# Patient Record
Sex: Male | Born: 1995 | Race: White | Hispanic: Yes | Marital: Single | Smoking: Never smoker
Health system: Southern US, Community
[De-identification: ages and names within clinical notes are randomized; demographics above are authoritative.]

---

## 2016-10-23 ENCOUNTER — Emergency Department (HOSPITAL_COMMUNITY)
Admission: EM | Admit: 2016-10-23 | Discharge: 2016-10-23 | Disposition: A | Discharge status: Home / Self Care | Payer: No Typology Code available for payment source | Attending: Emergency Medicine | Admitting: Emergency Medicine

## 2016-10-23 ENCOUNTER — Emergency Department (HOSPITAL_COMMUNITY): Payer: No Typology Code available for payment source

## 2016-10-23 ENCOUNTER — Encounter (HOSPITAL_COMMUNITY): Payer: Self-pay | Admitting: *Deleted

## 2016-10-23 DIAGNOSIS — S20211A Contusion of right front wall of thorax, initial encounter: Secondary | ICD-10-CM | POA: Diagnosis not present

## 2016-10-23 DIAGNOSIS — Y998 Other external cause status: Secondary | ICD-10-CM | POA: Diagnosis not present

## 2016-10-23 DIAGNOSIS — Y939 Activity, unspecified: Secondary | ICD-10-CM | POA: Insufficient documentation

## 2016-10-23 DIAGNOSIS — S299XXA Unspecified injury of thorax, initial encounter: Secondary | ICD-10-CM | POA: Diagnosis present

## 2016-10-23 DIAGNOSIS — Y92410 Unspecified street and highway as the place of occurrence of the external cause: Secondary | ICD-10-CM | POA: Insufficient documentation

## 2016-10-23 MED ORDER — CYCLOBENZAPRINE HCL 10 MG PO TABS: 10.0000 mg | ORAL_TABLET | Freq: Two times a day (BID) | ORAL | 0 refills | Status: DC | PRN

## 2016-10-23 MED ORDER — KETOROLAC TROMETHAMINE 30 MG/ML IJ SOLN
30.0000 mg | Freq: Once | INTRAMUSCULAR | Status: AC
  Administered 2016-10-23: 30 mg via INTRAMUSCULAR
  Filled 2016-10-23: qty 1

## 2016-10-23 MED ORDER — IBUPROFEN 600 MG PO TABS: 600.0000 mg | ORAL_TABLET | Freq: Four times a day (QID) | ORAL | 0 refills | Status: DC | PRN

## 2016-10-23 NOTE — ED Notes (Signed)
Patient transported to X-ray 

## 2016-10-23 NOTE — ED Notes (Signed)
Got patient undress on the monitor patient is resting with family at beside call bell at reach
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
## 2017-07-18 ENCOUNTER — Other Ambulatory Visit: Payer: Self-pay

## 2017-07-18 ENCOUNTER — Encounter: Payer: Self-pay | Admitting: Emergency Medicine

## 2017-07-18 ENCOUNTER — Ambulatory Visit: Payer: 59 | Admitting: Emergency Medicine

## 2017-07-18 VITALS — BP 127/66 | HR 69 | Temp 98.9°F | Resp 16 | Ht 68.0 in | Wt 212.6 lb

## 2017-07-18 DIAGNOSIS — H9203 Otalgia, bilateral: Secondary | ICD-10-CM

## 2017-07-18 DIAGNOSIS — H6693 Otitis media, unspecified, bilateral: Secondary | ICD-10-CM | POA: Diagnosis not present

## 2017-07-18 MED ORDER — HYDROCORTISONE-ACETIC ACID 1-2 % OT SOLN: 3.0000 [drp] | Freq: Three times a day (TID) | OTIC | 1 refills | Status: DC

## 2017-07-18 MED ORDER — CIPROFLOXACIN HCL 500 MG PO TABS: 500.0000 mg | ORAL_TABLET | Freq: Two times a day (BID) | ORAL | 0 refills | Status: DC

## 2017-07-18 NOTE — Patient Instructions (Addendum)
     IF you received an x-ray today, you will receive an invoice [facility]. Please contact [facility] at [phone] with questions or concerns regarding your invoice.   IF you received labwork today, you will receive an invoice from [facility]. Please contact [facility] at [phone] with questions or concerns regarding your invoice.   Our billing staff will not be able to assist you with questions regarding bills from these companies.  You will be contacted with the lab results as soon as they are available. The fastest way to get your results is to activate your My Chart account. Instructions are located on the last page of this paperwork. If you have not heard from us regarding the results in 2 weeks, please contact this office.     Otitis externa (Otitis Externa) La otitis externa es una infeccin del canal auditivo externo. El canal auditivo externo se ubica entre la parte externa del odo y la membrana del tmpano. A veces a la otitis externa se la conoce como "odo del nadador". CUIDADOS EN EL HOGAR  Si le indicaron que se coloque gotas para los odos con antibitico, pngaselas como se lo haya indicado el mdico. No deje de usarlas aunque la afeccin mejore.  Tome los medicamentos de venta libre y los recetados solamente como se lo haya indicado el mdico.  OceanographerConcurra a todas las visitas de control como se lo haya indicado el mdico. Esto es importante. PREVENCIN  Mantenga el odo seco. Use la punta de una toalla para secarse el odo despus de nadar o darse un bao.  Intente no rascarse ni ponerse cosas en el odo. Esto facilita el ingreso de los grmenes al odo.  No nade en lagos, agua sucia o piscinas que pueden no contener la cantidad correcta de una sustancia qumica llamada cloro.  Considere la posibilidad de preparar gotas ticas y ponerse 3 o 4gotas en cada odo despus de nadar. Pregntele al mdico cmo puede preparar las gotas para los  odos. SOLICITE AYUDA SI:  Tiene fiebre.  El odo sigue estando rojo, hinchado o le duele despus de 3das.  Sigue teniendo secrecin de pus despus de 3das.  El dolor, la hinchazn o el enrojecimiento empeoran.  Siente un dolor de cabeza muy intenso.  Tiene enrojecimiento, hinchazn, dolor o sensibilidad detrs de Museum/gallery conservatorla oreja. Esta informacin no tiene Theme park managercomo fin reemplazar el consejo del mdico. Asegrese de hacerle al mdico cualquier pregunta que tenga. Document Released: 07/08/2011 Document Revised: 05/06/2014 Document Reviewed: 01/23/2015 Elsevier Interactive Patient Education  [employer]2018 Elsevier Inc.

## 2017-07-18 NOTE — Progress Notes (Signed)
Robert Olson 22 y.o.   Chief Complaint  Patient presents with  . Ear Pain    x 2 months RIGHT has pain and LEFT has pain with ringing    HISTORY OF PRESENT ILLNESS: This is a 22 y.o. male complaining of bilateral ear pain for months maybe even a year.  Hearing on the left ear has been affected.  Denies trauma or any other significant symptoms.  Corporate investment banker.  Non-smoker.  Presently on no medications.  HPI   Prior to Admission medications   Medication Sig Start Date End Date Taking? Authorizing Provider  cyclobenzaprine (FLEXERIL) 10 MG tablet Take 1 tablet (10 mg total) by mouth 2 (two) times daily as needed for muscle spasms. Patient not taking: Reported on 07/18/2017 10/23/16   Jacalyn Lefevre, MD  ibuprofen (ADVIL,MOTRIN) 600 MG tablet Take 1 tablet (600 mg total) by mouth every 6 (six) hours as needed. Patient not taking: Reported on 07/18/2017 10/23/16   Jacalyn Lefevre, MD    Allergies  Allergen Reactions  . Penicillins     There are no active problems to display for this patient.   No past medical history on file.  No past surgical history on file.  Social History   Socioeconomic History  . Marital status: Single    Spouse name: Not on file  . Number of children: Not on file  . Years of education: Not on file  . Highest education level: Not on file  Occupational History  . Not on file  Social Needs  . Financial resource strain: Not on file  . Food insecurity:    Worry: Not on file    Inability: Not on file  . Transportation needs:    Medical: Not on file    Non-medical: Not on file  Tobacco Use  . Smoking status: Never Smoker  . Smokeless tobacco: Never Used  Substance and Sexual Activity  . Alcohol use: Yes    Comment: occ  . Drug use: No  . Sexual activity: Not on file  Lifestyle  . Physical activity:    Days per week: Not on file    Minutes per session: Not on file  . Stress: Not on file  Relationships  . Social connections:    Talks  on phone: Not on file    Gets together: Not on file    Attends religious service: Not on file    Active member of club or organization: Not on file    Attends meetings of clubs or organizations: Not on file    Relationship status: Not on file  . Intimate partner violence:    Fear of current or ex partner: Not on file    Emotionally abused: Not on file    Physically abused: Not on file    Forced sexual activity: Not on file  Other Topics Concern  . Not on file  Social History Narrative  . Not on file    No family history on file.   Review of Systems  Constitutional: Negative.  Negative for chills, fever and weight loss.  HENT: Positive for ear discharge, ear pain and hearing loss. Negative for congestion, nosebleeds, sinus pain and sore throat.   Eyes: Negative.  Negative for blurred vision, double vision, discharge and redness.  Respiratory: Negative.  Negative for cough and shortness of breath.   Cardiovascular: Negative.  Negative for chest pain and palpitations.  Gastrointestinal: Negative.  Negative for abdominal pain, diarrhea, nausea and vomiting.  Genitourinary: Negative.  Negative  for hematuria.  Musculoskeletal: Negative.  Negative for back pain, myalgias and neck pain.  Skin: Negative.  Negative for rash.  Neurological: Negative.  Negative for dizziness and headaches.  Endo/Heme/Allergies: Negative.   All other systems reviewed and are negative.    Vitals:   07/18/17 1715  BP: 127/66  Pulse: 69  Resp: 16  Temp: 98.9 F (37.2 C)  SpO2: 98%     Physical Exam  Constitutional: He is oriented to person, place, and time. He appears well-developed and well-nourished.  HENT:  Head: Normocephalic and atraumatic.  Mouth/Throat: Oropharynx is clear and moist.  Both ears: Diminished hearing on the left side.  Both external canals are excoriated with chronic erythema and debris on the inside.  Scarred TMs bilaterally.  Eyes: Pupils are equal, round, and reactive to  light. Conjunctivae and EOM are normal.  Neck: Normal range of motion. Neck supple.  Cardiovascular: Normal rate and regular rhythm.  Pulmonary/Chest: Effort normal and breath sounds normal.  Musculoskeletal: Normal range of motion.  Lymphadenopathy:    He has no cervical adenopathy.  Neurological: He is alert and oriented to person, place, and time. No sensory deficit. He exhibits normal muscle tone.  Skin: Skin is warm and dry. Capillary refill takes less than 2 seconds. No rash noted.  Psychiatric: He has a normal mood and affect. His behavior is normal.  Vitals reviewed.   A total of 30 minutes was spent in the room with the patient, greater than 50% of which was in counseling/coordination of care.  ASSESSMENT & PLAN: Robert Olson was seen today for ear pain.  Diagnoses and all orders for this visit:  Otalgia, bilateral -     ciprofloxacin (CIPRO) 500 MG tablet; Take 1 tablet (500 mg total) by mouth 2 (two) times daily. -     acetic acid-hydrocortisone (VOSOL-HC) OTIC solution; Place 3 drops into the left ear 3 (three) times daily. -     Ambulatory referral to ENT  Chronic ear infection, bilateral -     ciprofloxacin (CIPRO) 500 MG tablet; Take 1 tablet (500 mg total) by mouth 2 (two) times daily. -     acetic acid-hydrocortisone (VOSOL-HC) OTIC solution; Place 3 drops into the left ear 3 (three) times daily. -     Ambulatory referral to ENT    Patient Instructions       IF you received an x-ray today, you will receive an invoice [facility]. Please contact [facility] at [phone] with questions or concerns regarding your invoice.   IF you received labwork today, you will receive an invoice from [facility]. Please contact [facility] at [phone] with questions or concerns regarding your invoice.   Our billing staff will not be able to assist you with questions regarding bills from these companies.  You will be contacted with the lab results as soon  as they are available. The fastest way to get your results is to activate your My Chart account. Instructions are located on the last page of this paperwork. If you have not heard from us regarding the results in 2 weeks, please contact this office.     Otitis externa (Otitis Externa) La otitis externa es una infeccin del canal auditivo externo. El canal auditivo externo se ubica entre la parte externa del odo y la membrana del tmpano. A veces a la otitis externa se la conoce como "odo del nadador". CUIDADOS EN EL HOGAR  Si le indicaron que se coloque gotas para los odos con antibitico, pngaselas  como se lo haya indicado el mdico. No deje de usarlas aunque la afeccin mejore.  Tome los medicamentos de venta libre y los recetados solamente como se lo haya indicado el mdico.  Oceanographer a todas las visitas de control como se lo haya indicado el mdico. Esto es importante. PREVENCIN  Mantenga el odo seco. Use la punta de una toalla para secarse el odo despus de nadar o darse un bao.  Intente no rascarse ni ponerse cosas en el odo. Esto facilita el ingreso de los grmenes al odo.  No nade en lagos, agua sucia o piscinas que pueden no contener la cantidad correcta de una sustancia qumica llamada cloro.  Considere la posibilidad de preparar gotas ticas y ponerse 3 o 4gotas en cada odo despus de nadar. Pregntele al mdico cmo puede preparar las gotas para los odos. SOLICITE AYUDA SI:  Tiene fiebre.  El odo sigue estando rojo, hinchado o le duele despus de 3das.  Sigue teniendo secrecin de pus despus de 3das.  El dolor, la hinchazn o el enrojecimiento empeoran.  Siente un dolor de cabeza muy intenso.  Tiene enrojecimiento, hinchazn, dolor o sensibilidad detrs de Museum/gallery conservator. Esta informacin no tiene Theme park manager el consejo del mdico. Asegrese de hacerle al mdico cualquier pregunta que tenga. Document Released: 07/08/2011 Document Revised:  05/06/2014 Document Reviewed: 01/23/2015 Elsevier Interactive Patient Education  2018 Elsevier Inc.      Edwina Barth, MD [facility]

## 2017-07-25 ENCOUNTER — Ambulatory Visit (INDEPENDENT_AMBULATORY_CARE_PROVIDER_SITE_OTHER): Payer: 59 | Admitting: Emergency Medicine

## 2017-07-25 ENCOUNTER — Other Ambulatory Visit: Payer: Self-pay

## 2017-07-25 ENCOUNTER — Encounter: Payer: Self-pay | Admitting: Emergency Medicine

## 2017-07-25 VITALS — BP 116/71 | HR 56 | Temp 98.2°F | Resp 16 | Ht 68.0 in | Wt 211.8 lb

## 2017-07-25 DIAGNOSIS — Z0000 Encounter for general adult medical examination without abnormal findings: Secondary | ICD-10-CM | POA: Diagnosis not present

## 2017-07-25 NOTE — Patient Instructions (Addendum)
IF you received an x-ray today, you will receive an invoice from [facility]. Please contact [facility] at [phone] with questions or concerns regarding your invoice.   IF you received labwork today, you will receive an invoice from [facility]. Please contact [facility] at [phone] with questions or concerns regarding your invoice.   Our billing staff will not be able to assist you with questions regarding bills from these companies.  You will be contacted with the lab results as soon as they are available. The fastest way to get your results is to activate your My Chart account. Instructions are located on the last page of this paperwork. If you have not heard from [place] regarding the results in 2 weeks, please contact this office.        IF you received an x-ray today, you will receive an invoice from [facility]. Please contact [facility] at [phone] with questions or concerns regarding your invoice.   IF you received labwork today, you will receive an invoice from [facility]. Please contact [facility] at [phone] with questions or concerns regarding your invoice.   Our billing staff will not be able to assist you with questions regarding bills from these companies.  You will be contacted with the lab results as soon as they are available. The fastest way to get your results is to activate your My Chart account. Instructions are located on the last page of this paperwork. If you have not heard from [place] regarding the results in 2 weeks, please contact this office.      Health Maintenance, Male A healthy lifestyle and preventive care is important for your health and wellness. Ask your health care provider about what schedule of regular examinations is right for you. What should I know about weight and diet? Eat a Healthy Diet  Eat plenty of vegetables, fruits, whole grains, low-fat dairy products, and lean protein.  Do not eat a lot of  foods high in solid fats, added sugars, or salt.  Maintain a Healthy Weight Regular exercise can help you achieve or maintain a healthy weight. You should:  Do at least 150 minutes of exercise each week. The exercise should increase your heart rate and make you sweat (moderate-intensity exercise).  Do strength-training exercises at least twice a week.  Watch Your Levels of Cholesterol and Blood Lipids  Have your blood tested for lipids and cholesterol every 5 years starting at 22 years of age. If you are at high risk for heart disease, you should start having your blood tested when you are 22 years old. You may need to have your cholesterol levels checked more often if: ? Your lipid or cholesterol levels are high. ? You are older than 22 years of age. ? You are at high risk for heart disease.  What should I know about cancer screening? Many types of cancers can be detected early and may often be prevented. Lung Cancer  You should be screened every year for lung cancer if: ? You are a current smoker who has smoked for at least 30 years. ? You are a former smoker who has quit within the past 15 years.  Talk to your health care provider about your screening options, when you should start screening, and how often you should be screened.  Colorectal Cancer  Routine colorectal cancer screening usually begins at 22 years of age and should be repeated every 5-10 years until you are 22 years old. You may need to be  screened more often if early forms of precancerous polyps or small growths are found. Your health care provider may recommend screening at an earlier age if you have risk factors for colon cancer.  Your health care provider may recommend using home test kits to check for hidden blood in the stool.  A small camera at the end of a tube can be used to examine your colon (sigmoidoscopy or colonoscopy). This checks for the earliest forms of colorectal cancer.  Prostate and Testicular  Cancer  Depending on your age and overall health, your health care provider may do certain tests to screen for prostate and testicular cancer.  Talk to your health care provider about any symptoms or concerns you have about testicular or prostate cancer.  Skin Cancer  Check your skin from head to toe regularly.  Tell your health care provider about any new moles or changes in moles, especially if: ? There is a change in a mole's size, shape, or color. ? You have a mole that is larger than a pencil eraser.  Always use sunscreen. Apply sunscreen liberally and repeat throughout the day.  Protect yourself by wearing long sleeves, pants, a wide-brimmed hat, and sunglasses when outside.  What should I know about heart disease, diabetes, and high blood pressure?  If you are 62-81 years of age, have your blood pressure checked every 3-5 years. If you are 40 years of age or older, have your blood pressure checked every year. You should have your blood pressure measured twice-once when you are at a hospital or clinic, and once when you are not at a hospital or clinic. Record the average of the two measurements. To check your blood pressure when you are not at a hospital or clinic, you can use: ? An automated blood pressure machine at a pharmacy. ? A home blood pressure monitor.  Talk to your health care provider about your target blood pressure.  If you are between 20-8 years old, ask your health care provider if you should take aspirin to prevent heart disease.  Have regular diabetes screenings by checking your fasting blood sugar level. ? If you are at a normal weight and have a low risk for diabetes, have this test once every three years after the age of 60. ? If you are overweight and have a high risk for diabetes, consider being tested at a younger age or more often.  A one-time screening for abdominal aortic aneurysm (AAA) by ultrasound is recommended for men aged 65-75 years who are  current or former smokers. What should I know about preventing infection? Hepatitis B If you have a higher risk for hepatitis B, you should be screened for this virus. Talk with your health care provider to find out if you are at risk for hepatitis B infection. Hepatitis C Blood testing is recommended for:  Everyone born from [birth year] through 1965.  Anyone with known risk factors for hepatitis C.  Sexually Transmitted Diseases (STDs)  You should be screened each year for STDs including gonorrhea and chlamydia if: ? You are sexually active and are younger than 22 years of age. ? You are older than 22 years of age and your health care provider tells you that you are at risk for this type of infection. ? Your sexual activity has changed since you were last screened and you are at an increased risk for chlamydia or gonorrhea. Ask your health care provider if you are at risk.  Talk with your health  care provider about whether you are at high risk of being infected with HIV. Your health care provider may recommend a prescription medicine to help prevent HIV infection.  What else can I do?  Schedule regular health, dental, and eye exams.  Stay current with your vaccines (immunizations).  Do not use any tobacco products, such as cigarettes, chewing tobacco, and e-cigarettes. If you need help quitting, ask your health care provider.  Limit alcohol intake to no more than 2 drinks per day. One drink equals 12 ounces of beer, 5 ounces of wine, or 1 ounces of hard liquor.  Do not use street drugs.  Do not share needles.  Ask your health care provider for help if you need support or information about quitting drugs.  Tell your health care provider if you often feel depressed.  Tell your health care provider if you have ever been abused or do not feel safe at home. This information is not intended to replace advice given to you by your health care provider. Make sure you discuss any questions  you have with your health care provider. Document Released: 10/12/2007 Document Revised: 12/13/2015 Document Reviewed: 01/17/2015 Elsevier Interactive Patient Education  2018 [employer]  Mantenimiento de la salud en los hombres (Health Maintenance, Male) Un estilo de vida saludable y los cuidados preventivos son importantes para la salud y Counsellorel bienestar. Pregntele al mdico cul es el cronograma de exmenes peridicos adecuado para usted. QU DEBO SABER SOBRE EL PESO Y LA DIETA? Consuma una dieta saludable  Coma muchas verduras, frutas, cereales integrales, productos lcteos con bajo contenido de grasa y Associate Professorprotenas magras.  No consuma muchos alimentos de alto contenido de grasas slidas, azcares agregados o sal. Mantenga un peso saludable La actividad fsica habitual puede ayudarlo a Baristaalcanzar o mantener un peso saludable. Deber hacer lo siguiente:  Realizar al menos 150minutos de actividad fsica por semana. El ejercicio debe aumentar la frecuencia cardaca y Development worker, international aidprovocar la transpiracin (ejercicio de Woodsonintensidad moderada).  Hacer ejercicios de entrenamiento de fuerza por lo Rite Aidmenos dos veces por semana. Controlarse los niveles de colesterol y lpidos en la sangre  Hgase anlisis de sangre para controlar los lpidos y el colesterol cada 5aos a partir de los 35aos. Si tiene un riesgo alto de Warehouse managertener cardiopatas coronarias, debe comenzar a Assuranthacerse anlisis de Thorsangre a los [place]20aos. Es posible que Insurance underwriternecesite controlar los niveles de colesterol con mayor frecuencia si: ? Sus niveles de lpidos y colesterol son altos. ? Es mayor de [id number]. ? Tiene un riesgo alto de tener cardiopatas coronarias. QU DEBO SABER SOBRE LAS PRUEBAS DE DETECCIN DEL CNCER? Muchos tipos de cncer se pueden detectar de manera temprana y a menudo prevenirse. Cncer de pulmn  Debe someterse a pruebas de deteccin de cncer de pulmn todos los aos en los siguientes casos: ? Si fuma actualmente y lo ha hecho  durante por lo menos 30aos. ? Si fue fumador que dej el hbito en el trmino de los ltimos 15aos.  Hable con el mdico sobre las opciones en relacin con los estudios de deteccin, cundo debe comenzar a Actuaryhacrselos y con Engineer, structuralqu frecuencia. Cncer colorrectal  Generalmente, las pruebas de deteccin habituales del cncer colorrectal comienzan a los 50aos y deben repetirse cada 5 a 10aos hasta los 75aos. Es posible que tenga que hacerse las pruebas con mayor frecuencia si se detectan formas tempranas de plipos precancerosos o pequeos bultos. Sin embargo, el mdico podr aconsejarle que lo haga antes, si tiene factores de riesgo para el  cncer de colon.  El mdico puede recomendarle que use kits de prueba caseros para Recruitment consultant oculta en la materia fecal.  Se puede usar una pequea cmara en el extremo de un tubo para examinar el colon (sigmoidoscopia o colonoscopia). Este estudio [employer] formas ms tempranas de Building services engineer. Cncer de prstata y de testculo  En funcin de la edad y del Mona de salud general, el mdico puede realizarle determinados estudios de deteccin del cncer de prstata y de testculo.  Hable con el mdico sobre cualquier sntoma o acerca de las inquietudes que tenga sobre el cncer de prstata o de testculo. Cncer de piel  Revise la piel de la cabeza a los pies con regularidad.  Informe al mdico si aparecen nuevos lunares o si nota cambios en los que ya tiene, especialmente en estos casos: ? Si hay un cambio en el tamao, la forma o el color del lunar. ? Si tiene un lunar que es ms grande que el tamao de una goma de Paramedic.  Siempre use pantalla solar. Aplquese pantalla solar de [place] y repetida a lo largo del Futures trader.  Use mangas y [employer], un sombrero de ala ancha y gafas para el sol cuando est al Guadalupe Dawn, para protegerse. QU DEBO SABER SOBRE LAS CARDIOPATAS CORONARIAS, LA DIABETES Y LA HIPERTENSIN  ARTERIAL?  Si usted tiene entre 18 y 39aos, debe medirse la presin arterial cada 3a 5aos. Si usted tiene 40aos o ms, debe medirse la presin arterial [employer]. Debe medirse la presin arterial dos veces: una vez cuando est en un hospital o una clnica y la otra vez cuando est en otro sitio. Registre el promedio de [employer]. Para controlar su presin arterial cuando no est en un hospital o Paulita Cradle, puede usar lo siguiente: ? Valere Dross automtica para medir la presin arterial en una farmacia. ? Un monitor para medir la presin arterial en el hogar.  Hable con el mdico [employer] ideales de la presin arterial.  Si tiene entre 45 y 79aos, consltele al mdico si debe tomar aspirina para evitar las cardiopatas coronarias.  Hgase anlisis habituales de deteccin de la diabetes; para ello, contrlese la glucemia en ayunas. ? Si su peso es normal y tiene un bajo riesgo de padecer diabetes, realcese este anlisis cada tres aos despus de los 45aos. ? Si tiene sobrepeso y un alto riesgo de padecer diabetes, considere someterse a este anlisis antes o con mayor frecuencia.  Para los hombres que tienen entre 65 y 41aos, y son o han sido fumadores, se recomienda un nico estudio con ecografa para Engineer, manufacturing un aneurisma de aorta abdominal (AAA). QU DEBO SABER SOBRE LA PREVENCIN DE LAS INFECCIONES? HepatitisB Si tiene un riesgo ms alto de Primary school teacher hepatitis B, debe someterse a un examen de deteccin de este virus. Hable con el mdico para determinar si corre riesgo de tener una infeccin por hepatitisB. Hepatitis C Se recomienda un anlisis de [place] para:  Todos los que nacieron entre 1945 y [phone].  Todas las personas que tengan un riesgo de haber contrado hepatitis C. Enfermedades de transmisin sexual (ETS)  Debe realizarse pruebas de Airline pilot de las ETS todos los aos, incluidas la gonorrea y la clamidia, en estos casos: ? Es sexualmente  activo y es menor de [place]. ? Es mayor de 24aos, y Public affairs consultant informa que corre riesgo de tener este tipo de infecciones. ? La actividad sexual ha [place] desde [employer] hicieron  la ltima prueba de deteccin y tiene un riesgo mayor de tener clamidia o Copy. Pregntele al mdico si usted tiene riesgo.  Consulte a su mdico para saber si tiene un alto riesgo de infectarse por el VIH. El mdico puede recomendarle un medicamento de venta con receta para ayudar a evitar la infeccin por el VIH. QU MS PUEDO HACER?  Realcese los estudios de rutina de la salud, dentales y de Wellsite geologist.  Mantngase al da con las vacunas (inmunizaciones).  No consuma ningn producto que contenga tabaco, lo que incluye cigarrillos, tabaco de Theatre manager y Administrator, Civil Service. Si necesita ayuda para dejar de fumar, consulte al mdico.  Limite el consumo de alcohol a no ms de por da. [employer] a 12 onzas de cerveza, 5onzas de vino o 1onzas de bebidas alcohlicas de alta graduacin.  No consuma drogas.  No comparta agujas.  Solicite ayuda a su mdico si necesita apoyo o informacin para abandonar las drogas.  Informe a su mdico si a menudo se siente deprimido.  Notifique a su mdico si alguna vez ha sido vctima de abuso o si no se siente seguro en su hogar. Esta informacin no tiene Theme park manager el consejo del mdico. Asegrese de hacerle al mdico cualquier pregunta que tenga. Document Released: 10/12/2007 Document Revised: 05/06/2014 Document Reviewed: 01/17/2015 Elsevier Interactive Patient Education  [employer].

## 2017-07-25 NOTE — Progress Notes (Signed)
Robert Olson 22 y.o.   Chief Complaint  Patient presents with  . Annual Exam    HISTORY OF PRESENT ILLNESS: This is a 22 y.o. male Here for annual exam; no complaints and no medical concerns.   HPI   Prior to Admission medications   Medication Sig Start Date End Date Taking? Authorizing Provider  acetic acid-hydrocortisone (VOSOL-HC) OTIC solution Place 3 drops into the left ear 3 (three) times daily. 07/18/17  Yes Robert Olson, Robert Kempf, MD  ciprofloxacin (CIPRO) 500 MG tablet Take 1 tablet (500 mg total) by mouth 2 (two) times daily. 07/18/17  Yes Robert Olson, Robert Kempf, MD  cyclobenzaprine (FLEXERIL) 10 MG tablet Take 1 tablet (10 mg total) by mouth 2 (two) times daily as needed for muscle spasms. Patient not taking: Reported on 07/25/2017 10/23/16   Robert Lefevre, MD  ibuprofen (ADVIL,MOTRIN) 600 MG tablet Take 1 tablet (600 mg total) by mouth every 6 (six) hours as needed. Patient not taking: Reported on 07/18/2017 10/23/16   Robert Lefevre, MD    Allergies  Allergen Reactions  . Penicillins     Patient Active Problem List   Diagnosis Date Noted  . Otalgia, bilateral 07/18/2017  . Chronic ear infection, bilateral 07/18/2017    No past medical history on file.  No past surgical history on file.  Social History   Socioeconomic History  . Marital status: Single    Spouse name: Not on file  . Number of children: Not on file  . Years of education: Not on file  . Highest education level: Not on file  Occupational History  . Not on file  Social Needs  . Financial resource strain: Not on file  . Food insecurity:    Worry: Not on file    Inability: Not on file  . Transportation needs:    Medical: Not on file    Non-medical: Not on file  Tobacco Use  . Smoking status: Never Smoker  . Smokeless tobacco: Never Used  Substance and Sexual Activity  . Alcohol use: Yes    Comment: occ  . Drug use: No  . Sexual activity: Not on file  Lifestyle  . Physical activity:     Days per week: Not on file    Minutes per session: Not on file  . Stress: Not on file  Relationships  . Social connections:    Talks on phone: Not on file    Gets together: Not on file    Attends religious service: Not on file    Active member of club or organization: Not on file    Attends meetings of clubs or organizations: Not on file    Relationship status: Not on file  . Intimate partner violence:    Fear of current or ex partner: Not on file    Emotionally abused: Not on file    Physically abused: Not on file    Forced sexual activity: Not on file  Other Topics Concern  . Not on file  Social History Narrative  . Not on file    Family History  Problem Relation Age of Onset  . Diabetes Mother   . Diabetes Father      Review of Systems  Constitutional: Negative.  Negative for chills, fever and weight loss.  HENT: Negative for congestion, nosebleeds and sore throat.        Chronic ear issues.  Eyes: Negative.  Negative for blurred vision.  Respiratory: Negative.  Negative for cough and shortness of breath.  Cardiovascular: Negative.  Negative for chest pain and palpitations.  Gastrointestinal: Negative.  Negative for abdominal pain, diarrhea, nausea and vomiting.  Genitourinary: Negative.  Negative for dysuria and hematuria.  Musculoskeletal: Negative.  Negative for back pain, myalgias and neck pain.  Skin: Negative.  Negative for rash.  Neurological: Negative.  Negative for dizziness and headaches.  Endo/Heme/Allergies: Negative.   All other systems reviewed and are negative.   Vitals:   07/25/17 0921  BP: 116/71  Pulse: (!) 56  Resp: 16  Temp: 98.2 F (36.8 C)  SpO2: 97%    Physical Exam  Constitutional: He is oriented to person, place, and time. He appears well-developed and well-nourished.  HENT:  Head: Normocephalic and atraumatic.  Nose: Nose normal.  Mouth/Throat: Oropharynx is clear and moist. No oropharyngeal exudate.  Both ears show  similar findings: Scaly erythematous canals with debris partially obstructing the view of the TM.  Eyes: Pupils are equal, round, and reactive to light. Conjunctivae and EOM are normal.  Neck: Normal range of motion. Neck supple. No thyromegaly present.  Cardiovascular: Normal rate, regular rhythm and normal heart sounds.  Pulmonary/Chest: Effort normal and breath sounds normal. He exhibits no tenderness.  Abdominal: Soft. Bowel sounds are normal. He exhibits no distension and no mass. There is no tenderness. There is no rebound and no guarding. No hernia.  Musculoskeletal: Normal range of motion. He exhibits no edema or tenderness.  Lymphadenopathy:    He has no cervical adenopathy.  Neurological: He is alert and oriented to person, place, and time. No sensory deficit. He exhibits normal muscle tone. Coordination normal.  Skin: Skin is warm and dry. Capillary refill takes less than 2 seconds. No rash noted.  Psychiatric: He has a normal mood and affect. His behavior is normal.  Vitals reviewed.    ASSESSMENT & PLAN: [name] was seen today for annual exam.  Diagnoses and all orders for this visit:  Routine general medical examination at a health care facility -     CBC with Differential -     Comprehensive metabolic panel -     Hemoglobin A1c -     Lipid panel -     HIV antibody    Patient Instructions       IF you received an x-ray today, you will receive an invoice from [facility]. Please contact [facility] at [phone] with questions or concerns regarding your invoice.   IF you received labwork today, you will receive an invoice from [place]. Please contact [facility] at [phone] with questions or concerns regarding your invoice.   Our billing staff will not be able to assist you with questions regarding bills from these companies.  You will be contacted with the lab results as soon as they are available. The fastest way to get your results is to  activate your My Chart account. Instructions are located on the last page of this paperwork. If you have not heard from [place] regarding the results in 2 weeks, please contact this office.        IF you received an x-ray today, you will receive an invoice from [facility]. Please contact [facility] at [phone] with questions or concerns regarding your invoice.   IF you received labwork today, you will receive an invoice from [place]. Please contact [facility] at [phone] with questions or concerns regarding your invoice.   Our billing staff will not be able to assist you with questions regarding bills from these companies.  You will be contacted  with the lab results as soon as they are available. The fastest way to get your results is to activate your My Chart account. Instructions are located on the last page of this paperwork. If you have not heard from [place] regarding the results in 2 weeks, please contact this office.      Health Maintenance, Male A healthy lifestyle and preventive care is important for your health and wellness. Ask your health care provider about what schedule of regular examinations is right for you. What should I know about weight and diet? Eat a Healthy Diet  Eat plenty of vegetables, fruits, whole grains, low-fat dairy products, and lean protein.  Do not eat a lot of foods high in solid fats, added sugars, or salt.  Maintain a Healthy Weight Regular exercise can help you achieve or maintain a healthy weight. You should:  Do at least 150 minutes of exercise each week. The exercise should increase your heart rate and make you sweat (moderate-intensity exercise).  Do strength-training exercises at least twice a week.  Watch Your Levels of Cholesterol and Blood Lipids  Have your blood tested for lipids and cholesterol every 5 years starting at 22 years of age. If you are at high risk for heart disease, you should start having your blood  tested when you are 22 years old. You may need to have your cholesterol levels checked more often if: ? Your lipid or cholesterol levels are high. ? You are older than 22 years of age. ? You are at high risk for heart disease.  What should I know about cancer screening? Many types of cancers can be detected early and may often be prevented. Lung Cancer  You should be screened every year for lung cancer if: ? You are a current smoker who has smoked for at least 30 years. ? You are a former smoker who has quit within the past 15 years.  Talk to your health care provider about your screening options, when you should start screening, and how often you should be screened.  Colorectal Cancer  Routine colorectal cancer screening usually begins at 22 years of age and should be repeated every 5-10 years until you are 22 years old. You may need to be screened more often if early forms of precancerous polyps or small growths are found. Your health care provider may recommend screening at an earlier age if you have risk factors for colon cancer.  Your health care provider may recommend using home test kits to check for hidden blood in the stool.  A small camera at the end of a tube can be used to examine your colon (sigmoidoscopy or colonoscopy). This checks for the earliest forms of colorectal cancer.  Prostate and Testicular Cancer  Depending on your age and overall health, your health care provider may do certain tests to screen for prostate and testicular cancer.  Talk to your health care provider about any symptoms or concerns you have about testicular or prostate cancer.  Skin Cancer  Check your skin from head to toe regularly.  Tell your health care provider about any new moles or changes in moles, especially if: ? There is a change in a mole's size, shape, or color. ? You have a mole that is larger than a pencil eraser.  Always use sunscreen. Apply sunscreen liberally and repeat  throughout the day.  Protect yourself by wearing long sleeves, pants, a wide-brimmed hat, and sunglasses when outside.  What should I know about heart  disease, diabetes, and high blood pressure?  If you are 18-77 years of age, have your blood pressure checked every 3-5 years. If you are 55 years of age or older, have your blood pressure checked every year. You should have your blood pressure measured twice-once when you are at a hospital or clinic, and once when you are not at a hospital or clinic. Record the average of the two measurements. To check your blood pressure when you are not at a hospital or clinic, you can use: ? An automated blood pressure machine at a pharmacy. ? A home blood pressure monitor.  Talk to your health care provider about your target blood pressure.  If you are between 79-67 years old, ask your health care provider if you should take aspirin to prevent heart disease.  Have regular diabetes screenings by checking your fasting blood sugar level. ? If you are at a normal weight and have a low risk for diabetes, have this test once every three years after the age of 50. ? If you are overweight and have a high risk for diabetes, consider being tested at a younger age or more often.  A one-time screening for abdominal aortic aneurysm (AAA) by ultrasound is recommended for men aged 65-75 years who are current or former smokers. What should I know about preventing infection? Hepatitis B If you have a higher risk for hepatitis B, you should be screened for this virus. Talk with your health care provider to find out if you are at risk for hepatitis B infection. Hepatitis C Blood testing is recommended for:  Everyone born from 18 through 1965.  Anyone with known risk factors for hepatitis C.  Sexually Transmitted Diseases (STDs)  You should be screened each year for STDs including gonorrhea and chlamydia if: ? You are sexually active and are younger than 22 years of  age. ? You are older than 22 years of age and your health care provider tells you that you are at risk for this type of infection. ? Your sexual activity has changed since you were last screened and you are at an increased risk for chlamydia or gonorrhea. Ask your health care provider if you are at risk.  Talk with your health care provider about whether you are at high risk of being infected with HIV. Your health care provider may recommend a prescription medicine to help prevent HIV infection.  What else can I do?  Schedule regular health, dental, and eye exams.  Stay current with your vaccines (immunizations).  Do not use any tobacco products, such as cigarettes, chewing tobacco, and e-cigarettes. If you need help quitting, ask your health care provider.  Limit alcohol intake to no more than 2 drinks per day. One drink equals 12 ounces of beer, 5 ounces of wine, or 1 ounces of hard liquor.  Do not use street drugs.  Do not share needles.  Ask your health care provider for help if you need support or information about quitting drugs.  Tell your health care provider if you often feel depressed.  Tell your health care provider if you have ever been abused or do not feel safe at home. This information is not intended to replace advice given to you by your health care provider. Make sure you discuss any questions you have with your health care provider. Document Released: 10/12/2007 Document Revised: 12/13/2015 Document Reviewed: 01/17/2015 Elsevier Interactive Patient Education  2018 [employer].  Mantenimiento de la salud en los hombres (Health  Maintenance, Male) Un estilo de vida saludable y los cuidados preventivos son importantes para la salud y Counsellor. Pregntele al mdico cul es el cronograma de exmenes peridicos adecuado para usted. QU DEBO SABER SOBRE EL PESO Y LA DIETA? Consuma una dieta saludable  Coma muchas verduras, frutas, cereales integrales, productos  lcteos con bajo contenido de grasa y Associate Professor.  No consuma muchos alimentos de alto contenido de grasas slidas, azcares agregados o sal. Mantenga un peso saludable La actividad fsica habitual puede ayudarlo a Barista o mantener un peso saludable. Deber hacer lo siguiente:  Realizar al menos de actividad fsica por semana. El ejercicio debe aumentar la frecuencia cardaca y Development worker, international aid la transpiracin (ejercicio de [place]).  Hacer ejercicios de entrenamiento de fuerza por lo [employer] por semana. Controlarse los niveles de colesterol y lpidos en la sangre  Hgase anlisis de sangre para controlar los lpidos y el colesterol cada 5aos a partir de los 35aos. Si tiene un riesgo alto de Warehouse manager cardiopatas coronarias, debe comenzar a [employer] de [place] a los [place]. Es posible que Insurance underwriter los niveles de colesterol con mayor frecuencia si: ? Sus niveles de lpidos y colesterol son altos. ? Es mayor de 16XWR. ? Tiene un riesgo alto de tener cardiopatas coronarias. QU DEBO SABER SOBRE LAS PRUEBAS DE DETECCIN DEL CNCER? Muchos tipos de cncer se pueden detectar de manera temprana y a menudo prevenirse. Cncer de pulmn  Debe someterse a pruebas de deteccin de cncer de pulmn todos los aos en los siguientes casos: ? Si fuma actualmente y lo ha hecho durante por lo menos 30aos. ? Si fue fumador que dej el hbito en el trmino de los ltimos 15aos.  Hable con el mdico sobre las opciones en relacin con los estudios de deteccin, cundo debe comenzar a Actuary y con Engineer, structural. Cncer colorrectal  Generalmente, las pruebas de deteccin habituales del cncer colorrectal comienzan a los 50aos y deben repetirse cada 5 a 10aos hasta los 75aos. Es posible que tenga que hacerse las pruebas con mayor frecuencia si se detectan formas tempranas de plipos precancerosos o pequeos bultos. Sin embargo, el mdico podr  aconsejarle que lo haga antes, si tiene factores de riesgo para el cncer de colon.  El mdico puede recomendarle que use kits de prueba caseros para Recruitment consultant oculta en la materia fecal.  Se puede usar una pequea cmara en el extremo de un tubo para examinar el colon (sigmoidoscopia o colonoscopia). Este estudio [employer] formas ms tempranas de Building services engineer. Cncer de prstata y de testculo  En funcin de la edad y del [place] de salud general, el mdico puede realizarle determinados estudios de deteccin del cncer de prstata y de testculo.  Hable con el mdico sobre cualquier sntoma o acerca de las inquietudes que tenga sobre el cncer de prstata o de testculo. Cncer de piel  Revise la piel de la cabeza a los pies con regularidad.  Informe al mdico si aparecen nuevos lunares o si nota cambios en los que ya tiene, especialmente en estos casos: ? Si hay un cambio en el tamao, la forma o el color del lunar. ? Si tiene un lunar que es ms grande que el tamao de una goma de Paramedic.  Siempre use pantalla solar. Aplquese pantalla solar de [place] y repetida a lo largo del Futures trader.  Use mangas y Automatic Data, un sombrero de ala ancha y gafas para el sol cuando est al [place],  para protegerse. QU DEBO SABER SOBRE LAS CARDIOPATAS CORONARIAS, LA DIABETES Y LA HIPERTENSIN ARTERIAL?  Si usted tiene entre 18 y 39aos, debe medirse la presin arterial cada 3a 5aos. Si usted tiene 40aos o ms, debe medirse la presin arterial Allied Waste Industriestodos los aos. Debe medirse la presin arterial dos veces: una vez cuando est en un hospital o una clnica y la otra vez cuando est en otro sitio. Registre el promedio de Johnson Controlslas dos mediciones. Para controlar su presin arterial cuando no est en un hospital o Paulita Cradleuna clnica, puede usar lo siguiente: ? Valere DrossUna mquina automtica para medir la presin arterial en una farmacia. ? Un monitor para medir la presin arterial en el hogar.  Hable con  el mdico Lowe's Companiessobre los valores ideales de la presin arterial.  Si tiene entre 45 y 79aos, consltele al mdico si debe tomar aspirina para evitar las cardiopatas coronarias.  Hgase anlisis habituales de deteccin de la diabetes; para ello, contrlese la glucemia en ayunas. ? Si su peso es normal y tiene un bajo riesgo de padecer diabetes, realcese este anlisis cada tres aos despus de los 45aos. ? Si tiene sobrepeso y un alto riesgo de padecer diabetes, considere someterse a este anlisis antes o con mayor frecuencia.  Para los hombres que tienen entre 65 y 4975aos, y son o han sido fumadores, se recomienda un nico estudio con ecografa para Engineer, manufacturingdetectar un aneurisma de aorta abdominal (AAA). QU DEBO SABER SOBRE LA PREVENCIN DE LAS INFECCIONES? HepatitisB Si tiene un riesgo ms alto de Primary school teachercontraer hepatitis B, debe someterse a un examen de deteccin de este virus. Hable con el mdico para determinar si corre riesgo de tener una infeccin por hepatitisB. Hepatitis C Se recomienda un anlisis de Emsworthsangre para:  Todos los que nacieron entre 1945 y [id number].  Todas las personas que tengan un riesgo de haber contrado hepatitis C. Enfermedades de transmisin sexual (ETS)  Debe realizarse pruebas de Airline pilotdeteccin de las ETS todos los aos, incluidas la gonorrea y la clamidia, en estos casos: ? Es sexualmente activo y es menor de [place]24aos. ? Es mayor de 24aos, y Public affairs consultantel mdico le informa que corre riesgo de tener este tipo de infecciones. ? La actividad sexual ha cambiado desde que le hicieron la ltima prueba de deteccin y tiene un riesgo mayor de Warehouse managertener clamidia o Copygonorrea. Pregntele al mdico si usted tiene riesgo.  Consulte a su mdico para saber si tiene un alto riesgo de infectarse por el VIH. El mdico puede recomendarle un medicamento de venta con receta para ayudar a evitar la infeccin por el VIH. QU MS PUEDO HACER?  Realcese los estudios de rutina de la salud, dentales y de Research scientist (medical)la  vista.  Mantngase al da con las vacunas (inmunizaciones).  No consuma ningn producto que contenga tabaco, lo que incluye cigarrillos, tabaco de Theatre managermascar y Administrator, Civil Servicecigarrillos electrnicos. Si necesita ayuda para dejar de fumar, consulte al mdico.  Limite el consumo de alcohol a no ms de 2medidas por da. BorgWarnerUna medida equivale a 12 onzas de cerveza, 5onzas de vino o 1onzas de bebidas alcohlicas de alta graduacin.  No consuma drogas.  No comparta agujas.  Solicite ayuda a su mdico si necesita apoyo o informacin para abandonar las drogas.  Informe a su mdico si a menudo se siente deprimido.  Notifique a su mdico si alguna vez ha sido vctima de abuso o si no se siente seguro en su hogar. Esta informacin no tiene Theme park managercomo fin reemplazar el consejo del mdico. Asegrese de hacerle al  mdico cualquier pregunta que tenga. Document Released: 10/12/2007 Document Revised: 05/06/2014 Document Reviewed: 01/17/2015 Elsevier Interactive Patient Education  2018 Elsevier Inc.      Edwina Barth, MD [facility]

## 2017-07-26 LAB — LIPID PANEL
CHOL/HDL RATIO: 6.3 ratio — AB (ref 0.0–5.0)
Cholesterol, Total: 194 mg/dL (ref 100–199)
HDL: 31 mg/dL — AB (ref >39)
LDL Calculated: 135 mg/dL — ABNORMAL HIGH (ref 0–99)
TRIGLYCERIDES: 140 mg/dL (ref 0–149)
VLDL Cholesterol Cal: 28 mg/dL (ref 5–40)

## 2017-07-26 LAB — COMPREHENSIVE METABOLIC PANEL
ALBUMIN: 4.7 g/dL (ref 3.5–5.5)
ALK PHOS: 61 IU/L (ref 39–117)
ALT: 42 IU/L (ref 0–44)
AST: 23 IU/L (ref 0–40)
Albumin/Globulin Ratio: 1.9 (ref 1.2–2.2)
BILIRUBIN TOTAL: 0.4 mg/dL (ref 0.0–1.2)
BUN / CREAT RATIO: 15 (ref 9–20)
BUN: 14 mg/dL (ref 6–20)
CO2: 22 mmol/L (ref 20–29)
CREATININE: 0.91 mg/dL (ref 0.76–1.27)
Calcium: 9.5 mg/dL (ref 8.7–10.2)
Chloride: 103 mmol/L (ref 96–106)
GFR calc Af Amer: 139 mL/min/{1.73_m2} (ref >59)
GFR calc non Af Amer: 120 mL/min/{1.73_m2} (ref >59)
GLUCOSE: 100 mg/dL — AB (ref 65–99)
Globulin, Total: 2.5 g/dL (ref 1.5–4.5)
Potassium: 4.3 mmol/L (ref 3.5–5.2)
Sodium: 141 mmol/L (ref 134–144)
Total Protein: 7.2 g/dL (ref 6.0–8.5)

## 2017-07-26 LAB — CBC WITH DIFFERENTIAL/PLATELET
BASOS ABS: 0 10*3/uL (ref 0.0–0.2)
Basos: 1 %
EOS (ABSOLUTE): 0.2 10*3/uL (ref 0.0–0.4)
Eos: 3 %
HEMOGLOBIN: 15.2 g/dL (ref 13.0–17.7)
Hematocrit: 44.6 % (ref 37.5–51.0)
Immature Grans (Abs): 0 10*3/uL (ref 0.0–0.1)
Immature Granulocytes: 0 %
LYMPHS ABS: 2.1 10*3/uL (ref 0.7–3.1)
Lymphs: 38 %
MCH: 29.8 pg (ref 26.6–33.0)
MCHC: 34.1 g/dL (ref 31.5–35.7)
MCV: 88 fL (ref 79–97)
MONOCYTES: 8 %
MONOS ABS: 0.4 10*3/uL (ref 0.1–0.9)
NEUTROS ABS: 2.7 10*3/uL (ref 1.4–7.0)
Neutrophils: 50 %
PLATELETS: 238 10*3/uL (ref 150–379)
RBC: 5.1 x10E6/uL (ref 4.14–5.80)
RDW: 12.9 % (ref 12.3–15.4)
WBC: 5.4 10*3/uL (ref 3.4–10.8)

## 2017-07-26 LAB — HEMOGLOBIN A1C
ESTIMATED AVERAGE GLUCOSE: 117 mg/dL
HEMOGLOBIN A1C: 5.7 % — AB (ref 4.8–5.6)

## 2017-07-26 LAB — HIV ANTIBODY (ROUTINE TESTING W REFLEX): HIV Screen 4th Generation wRfx: NONREACTIVE

## 2017-07-29 ENCOUNTER — Encounter: Payer: Self-pay | Admitting: *Deleted

## 2018-05-24 ENCOUNTER — Emergency Department (HOSPITAL_COMMUNITY): Payer: No Typology Code available for payment source

## 2018-05-24 ENCOUNTER — Encounter (HOSPITAL_COMMUNITY): Payer: Self-pay | Admitting: Emergency Medicine

## 2018-05-24 ENCOUNTER — Emergency Department (HOSPITAL_COMMUNITY)
Admission: EM | Admit: 2018-05-24 | Discharge: 2018-05-24 | Disposition: A | Discharge status: Home / Self Care | Payer: No Typology Code available for payment source | Attending: Emergency Medicine | Admitting: Emergency Medicine

## 2018-05-24 DIAGNOSIS — S0990XA Unspecified injury of head, initial encounter: Secondary | ICD-10-CM

## 2018-05-24 DIAGNOSIS — S50811A Abrasion of right forearm, initial encounter: Secondary | ICD-10-CM | POA: Diagnosis not present

## 2018-05-24 DIAGNOSIS — Y939 Activity, unspecified: Secondary | ICD-10-CM | POA: Diagnosis not present

## 2018-05-24 DIAGNOSIS — S0001XA Abrasion of scalp, initial encounter: Secondary | ICD-10-CM | POA: Diagnosis not present

## 2018-05-24 DIAGNOSIS — S0081XA Abrasion of other part of head, initial encounter: Secondary | ICD-10-CM | POA: Insufficient documentation

## 2018-05-24 DIAGNOSIS — Y999 Unspecified external cause status: Secondary | ICD-10-CM | POA: Diagnosis not present

## 2018-05-24 DIAGNOSIS — T148XXA Other injury of unspecified body region, initial encounter: Secondary | ICD-10-CM

## 2018-05-24 DIAGNOSIS — Y929 Unspecified place or not applicable: Secondary | ICD-10-CM | POA: Insufficient documentation

## 2018-05-24 DIAGNOSIS — Z23 Encounter for immunization: Secondary | ICD-10-CM | POA: Diagnosis not present

## 2018-05-24 LAB — CBC WITH DIFFERENTIAL/PLATELET
ABS IMMATURE GRANULOCYTES: 0.02 10*3/uL (ref 0.00–0.07)
BASOS PCT: 0 %
Basophils Absolute: 0 10*3/uL (ref 0.0–0.1)
EOS PCT: 3 %
Eosinophils Absolute: 0.3 10*3/uL (ref 0.0–0.5)
HCT: 46.3 % (ref 39.0–52.0)
Hemoglobin: 15.2 g/dL (ref 13.0–17.0)
Immature Granulocytes: 0 %
Lymphocytes Relative: 39 %
Lymphs Abs: 3.2 10*3/uL (ref 0.7–4.0)
MCH: 30 pg (ref 26.0–34.0)
MCHC: 32.8 g/dL (ref 30.0–36.0)
MCV: 91.5 fL (ref 80.0–100.0)
MONO ABS: 0.6 10*3/uL (ref 0.1–1.0)
MONOS PCT: 7 %
NEUTROS ABS: 4.2 10*3/uL (ref 1.7–7.7)
Neutrophils Relative %: 51 %
PLATELETS: 224 10*3/uL (ref 150–400)
RBC: 5.06 MIL/uL (ref 4.22–5.81)
RDW: 11.9 % (ref 11.5–15.5)
WBC: 8.3 10*3/uL (ref 4.0–10.5)
nRBC: 0 % (ref 0.0–0.2)

## 2018-05-24 LAB — COMPREHENSIVE METABOLIC PANEL
ALK PHOS: 44 U/L (ref 38–126)
ALT: 33 U/L (ref 0–44)
AST: 24 U/L (ref 15–41)
Albumin: 4.4 g/dL (ref 3.5–5.0)
Anion gap: 10 (ref 5–15)
BUN: 13 mg/dL (ref 6–20)
CO2: 26 mmol/L (ref 22–32)
CREATININE: 1 mg/dL (ref 0.61–1.24)
Calcium: 9.4 mg/dL (ref 8.9–10.3)
Chloride: 104 mmol/L (ref 98–111)
GFR calc Af Amer: >60 mL/min (ref >60)
Glucose, Bld: 118 mg/dL — ABNORMAL HIGH (ref 70–99)
POTASSIUM: 3.8 mmol/L (ref 3.5–5.1)
SODIUM: 140 mmol/L (ref 135–145)
TOTAL PROTEIN: 7.2 g/dL (ref 6.5–8.1)
Total Bilirubin: 0.4 mg/dL (ref 0.3–1.2)

## 2018-05-24 MED ORDER — CYCLOBENZAPRINE HCL 10 MG PO TABS: 10.0000 mg | ORAL_TABLET | Freq: Three times a day (TID) | ORAL | 0 refills | Status: AC | PRN

## 2018-05-24 MED ORDER — BACITRACIN-NEOMYCIN-POLYMYXIN OINTMENT TUBE
TOPICAL_OINTMENT | Freq: Once | CUTANEOUS | Status: AC
  Administered 2018-05-24: 04:00:00 via TOPICAL
  Filled 2018-05-24: qty 14

## 2018-05-24 MED ORDER — TETANUS-DIPHTH-ACELL PERTUSSIS 5-2.5-18.5 LF-MCG/0.5 IM SUSP
0.5000 mL | Freq: Once | INTRAMUSCULAR | Status: AC
  Administered 2018-05-24: 0.5 mL via INTRAMUSCULAR
  Filled 2018-05-24: qty 0.5

## 2018-05-24 MED ORDER — IBUPROFEN 800 MG PO TABS: 800.0000 mg | ORAL_TABLET | Freq: Four times a day (QID) | ORAL | 0 refills | Status: AC | PRN
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
